# Patient Record
Sex: Female | Born: 1965 | Race: White | Hispanic: No | Marital: Married
Health system: Southern US, Community
[De-identification: ages and names within clinical notes are randomized; demographics above are authoritative.]

---

## 2006-11-01 ENCOUNTER — Ambulatory Visit: Payer: Self-pay | Admitting: Oncology

## 2006-12-27 ENCOUNTER — Ambulatory Visit: Payer: Self-pay | Admitting: Oncology

## 2007-03-05 ENCOUNTER — Ambulatory Visit: Payer: Self-pay | Admitting: Oncology

## 2017-11-28 DIAGNOSIS — D72829 Elevated white blood cell count, unspecified: Secondary | ICD-10-CM | POA: Diagnosis not present

## 2017-11-28 DIAGNOSIS — F17218 Nicotine dependence, cigarettes, with other nicotine-induced disorders: Secondary | ICD-10-CM

## 2018-06-03 ENCOUNTER — Other Ambulatory Visit: Payer: Self-pay | Admitting: Nurse Practitioner

## 2018-06-03 DIAGNOSIS — N63 Unspecified lump in unspecified breast: Secondary | ICD-10-CM

## 2018-06-12 ENCOUNTER — Ambulatory Visit
Admission: RE | Admit: 2018-06-12 | Discharge: 2018-06-12 | Disposition: A | Discharge status: Home / Self Care | Payer: No Typology Code available for payment source | Source: Ambulatory Visit | Attending: Nurse Practitioner | Admitting: Nurse Practitioner

## 2018-06-12 DIAGNOSIS — N63 Unspecified lump in unspecified breast: Secondary | ICD-10-CM

## 2019-02-25 DIAGNOSIS — E1140 Type 2 diabetes mellitus with diabetic neuropathy, unspecified: Secondary | ICD-10-CM | POA: Diagnosis not present

## 2019-02-25 DIAGNOSIS — E1169 Type 2 diabetes mellitus with other specified complication: Secondary | ICD-10-CM | POA: Diagnosis not present

## 2019-03-04 DIAGNOSIS — E1159 Type 2 diabetes mellitus with other circulatory complications: Secondary | ICD-10-CM | POA: Diagnosis not present

## 2019-03-04 DIAGNOSIS — E785 Hyperlipidemia, unspecified: Secondary | ICD-10-CM | POA: Diagnosis not present

## 2019-03-04 DIAGNOSIS — E1169 Type 2 diabetes mellitus with other specified complication: Secondary | ICD-10-CM | POA: Diagnosis not present

## 2019-03-04 DIAGNOSIS — E1140 Type 2 diabetes mellitus with diabetic neuropathy, unspecified: Secondary | ICD-10-CM | POA: Diagnosis not present

## 2019-03-04 DIAGNOSIS — F17200 Nicotine dependence, unspecified, uncomplicated: Secondary | ICD-10-CM | POA: Diagnosis not present

## 2019-03-24 DIAGNOSIS — Z6839 Body mass index (BMI) 39.0-39.9, adult: Secondary | ICD-10-CM | POA: Diagnosis not present

## 2019-03-24 DIAGNOSIS — R202 Paresthesia of skin: Secondary | ICD-10-CM | POA: Diagnosis not present

## 2019-03-24 DIAGNOSIS — J449 Chronic obstructive pulmonary disease, unspecified: Secondary | ICD-10-CM | POA: Diagnosis not present

## 2019-05-26 DIAGNOSIS — E1169 Type 2 diabetes mellitus with other specified complication: Secondary | ICD-10-CM | POA: Diagnosis not present

## 2019-06-02 DIAGNOSIS — E1159 Type 2 diabetes mellitus with other circulatory complications: Secondary | ICD-10-CM | POA: Diagnosis not present

## 2019-06-02 DIAGNOSIS — E785 Hyperlipidemia, unspecified: Secondary | ICD-10-CM | POA: Diagnosis not present

## 2019-06-02 DIAGNOSIS — E1140 Type 2 diabetes mellitus with diabetic neuropathy, unspecified: Secondary | ICD-10-CM | POA: Diagnosis not present

## 2019-06-02 DIAGNOSIS — Z23 Encounter for immunization: Secondary | ICD-10-CM | POA: Diagnosis not present

## 2019-06-02 DIAGNOSIS — E1169 Type 2 diabetes mellitus with other specified complication: Secondary | ICD-10-CM | POA: Diagnosis not present

## 2019-08-26 DIAGNOSIS — E1169 Type 2 diabetes mellitus with other specified complication: Secondary | ICD-10-CM | POA: Diagnosis not present

## 2019-09-01 DIAGNOSIS — F17200 Nicotine dependence, unspecified, uncomplicated: Secondary | ICD-10-CM | POA: Diagnosis not present

## 2019-09-01 DIAGNOSIS — E1169 Type 2 diabetes mellitus with other specified complication: Secondary | ICD-10-CM | POA: Diagnosis not present

## 2019-09-01 DIAGNOSIS — E785 Hyperlipidemia, unspecified: Secondary | ICD-10-CM | POA: Diagnosis not present

## 2019-09-01 DIAGNOSIS — E1140 Type 2 diabetes mellitus with diabetic neuropathy, unspecified: Secondary | ICD-10-CM | POA: Diagnosis not present

## 2019-11-02 DIAGNOSIS — R05 Cough: Secondary | ICD-10-CM | POA: Diagnosis not present

## 2019-11-02 DIAGNOSIS — R0789 Other chest pain: Secondary | ICD-10-CM | POA: Diagnosis not present

## 2019-11-02 DIAGNOSIS — Z6839 Body mass index (BMI) 39.0-39.9, adult: Secondary | ICD-10-CM | POA: Diagnosis not present

## 2019-11-02 DIAGNOSIS — R079 Chest pain, unspecified: Secondary | ICD-10-CM | POA: Diagnosis not present

## 2019-11-10 DIAGNOSIS — E119 Type 2 diabetes mellitus without complications: Secondary | ICD-10-CM | POA: Diagnosis not present

## 2019-11-10 DIAGNOSIS — H20013 Primary iridocyclitis, bilateral: Secondary | ICD-10-CM | POA: Diagnosis not present

## 2019-11-10 DIAGNOSIS — H35033 Hypertensive retinopathy, bilateral: Secondary | ICD-10-CM | POA: Diagnosis not present

## 2019-11-10 DIAGNOSIS — H15003 Unspecified scleritis, bilateral: Secondary | ICD-10-CM | POA: Diagnosis not present

## 2019-11-19 DIAGNOSIS — H209 Unspecified iridocyclitis: Secondary | ICD-10-CM | POA: Diagnosis not present

## 2019-12-03 DIAGNOSIS — E1169 Type 2 diabetes mellitus with other specified complication: Secondary | ICD-10-CM | POA: Diagnosis not present

## 2019-12-03 DIAGNOSIS — Z6839 Body mass index (BMI) 39.0-39.9, adult: Secondary | ICD-10-CM | POA: Diagnosis not present

## 2019-12-03 DIAGNOSIS — M2550 Pain in unspecified joint: Secondary | ICD-10-CM | POA: Diagnosis not present

## 2019-12-03 DIAGNOSIS — R202 Paresthesia of skin: Secondary | ICD-10-CM | POA: Diagnosis not present

## 2019-12-10 DIAGNOSIS — D72829 Elevated white blood cell count, unspecified: Secondary | ICD-10-CM | POA: Diagnosis not present

## 2019-12-10 DIAGNOSIS — E1159 Type 2 diabetes mellitus with other circulatory complications: Secondary | ICD-10-CM | POA: Diagnosis not present

## 2019-12-10 DIAGNOSIS — E1140 Type 2 diabetes mellitus with diabetic neuropathy, unspecified: Secondary | ICD-10-CM | POA: Diagnosis not present

## 2019-12-10 DIAGNOSIS — I152 Hypertension secondary to endocrine disorders: Secondary | ICD-10-CM | POA: Diagnosis not present

## 2019-12-14 DIAGNOSIS — D72829 Elevated white blood cell count, unspecified: Secondary | ICD-10-CM | POA: Diagnosis not present

## 2019-12-16 DIAGNOSIS — H209 Unspecified iridocyclitis: Secondary | ICD-10-CM | POA: Diagnosis not present

## 2019-12-28 DIAGNOSIS — D72829 Elevated white blood cell count, unspecified: Secondary | ICD-10-CM | POA: Diagnosis not present

## 2019-12-28 DIAGNOSIS — F17210 Nicotine dependence, cigarettes, uncomplicated: Secondary | ICD-10-CM | POA: Diagnosis not present

## 2020-01-05 DIAGNOSIS — G5600 Carpal tunnel syndrome, unspecified upper limb: Secondary | ICD-10-CM | POA: Diagnosis not present

## 2020-01-05 DIAGNOSIS — Z6839 Body mass index (BMI) 39.0-39.9, adult: Secondary | ICD-10-CM | POA: Diagnosis not present

## 2020-01-14 DIAGNOSIS — Z1211 Encounter for screening for malignant neoplasm of colon: Secondary | ICD-10-CM | POA: Diagnosis not present

## 2020-01-20 DIAGNOSIS — R200 Anesthesia of skin: Secondary | ICD-10-CM | POA: Diagnosis not present

## 2020-01-20 DIAGNOSIS — M7711 Lateral epicondylitis, right elbow: Secondary | ICD-10-CM | POA: Diagnosis not present

## 2020-01-20 DIAGNOSIS — M65331 Trigger finger, right middle finger: Secondary | ICD-10-CM | POA: Diagnosis not present

## 2020-01-20 DIAGNOSIS — M65342 Trigger finger, left ring finger: Secondary | ICD-10-CM | POA: Diagnosis not present

## 2020-02-18 DIAGNOSIS — E1169 Type 2 diabetes mellitus with other specified complication: Secondary | ICD-10-CM | POA: Diagnosis not present

## 2020-02-25 DIAGNOSIS — E1169 Type 2 diabetes mellitus with other specified complication: Secondary | ICD-10-CM | POA: Diagnosis not present

## 2020-02-25 DIAGNOSIS — I10 Essential (primary) hypertension: Secondary | ICD-10-CM | POA: Diagnosis not present

## 2020-02-25 DIAGNOSIS — E785 Hyperlipidemia, unspecified: Secondary | ICD-10-CM | POA: Diagnosis not present

## 2020-02-25 DIAGNOSIS — F17210 Nicotine dependence, cigarettes, uncomplicated: Secondary | ICD-10-CM | POA: Diagnosis not present

## 2020-02-25 DIAGNOSIS — Z7189 Other specified counseling: Secondary | ICD-10-CM | POA: Diagnosis not present

## 2020-02-25 DIAGNOSIS — E1140 Type 2 diabetes mellitus with diabetic neuropathy, unspecified: Secondary | ICD-10-CM | POA: Diagnosis not present

## 2020-02-25 DIAGNOSIS — F17200 Nicotine dependence, unspecified, uncomplicated: Secondary | ICD-10-CM | POA: Diagnosis not present

## 2020-02-25 DIAGNOSIS — Z23 Encounter for immunization: Secondary | ICD-10-CM | POA: Diagnosis not present

## 2020-03-07 ENCOUNTER — Other Ambulatory Visit: Payer: Self-pay

## 2020-03-07 DIAGNOSIS — R202 Paresthesia of skin: Secondary | ICD-10-CM

## 2020-03-08 ENCOUNTER — Ambulatory Visit (INDEPENDENT_AMBULATORY_CARE_PROVIDER_SITE_OTHER): Payer: BC Managed Care – PPO | Admitting: Neurology

## 2020-03-08 ENCOUNTER — Other Ambulatory Visit: Payer: Self-pay

## 2020-03-08 DIAGNOSIS — R202 Paresthesia of skin: Secondary | ICD-10-CM | POA: Diagnosis not present

## 2020-03-08 DIAGNOSIS — G5603 Carpal tunnel syndrome, bilateral upper limbs: Secondary | ICD-10-CM

## 2020-03-08 NOTE — Procedures (Signed)
[facility]  [address], [address]  [place], [place] [address] Tel: [phone] Fax:  [phone] Test Date:  03/08/2020  Patient: Shannon Drake DOB: 02/13/1966 Physician: Nita Sickle, DO  Sex: Female Height: 5\' 9"  Ref Phys: , MD  ID#: [id number] Salt Temp: 34.0C Technician:    Patient Complaints: This is a 54 year old female referred for evaluation of bilateral hand pain and paresthesias.  NCV & EMG Findings: Extensive electrodiagnostic testing of the right upper extremity and additional studies of the left shows:  1. Right median sensory response shows prolonged latency (4.5 ms) and reduced amplitude (9.6 V). Left median sensory response shows prolonged latency (4.1 ms). Bilateral ulnar sensory responses are within normal limits, although left is asymmetrically reduced as compared to the right (L13.4, L24.8 9.6 V). 2. Right median motor response shows prolonged latency (4.4 ms). Left ulnar motor response shows slowed conduction velocity across the elbow (A Elbow-B Elbow, 45 m/s). Right ulnar and left median motor responses are within normal limits.  3. There is no evidence of active or chronic motor axonal loss changes affecting any of the tested muscles.  Motor unit configuration and recruitment pattern is within normal limits.    Impression: 1. 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   2. Left median neuropathy at or distal to the wrist (mild), consistent with a clinical diagnosis of carpal tunnel syndrome. 3. Left ulnar neuropathy with slowing across the elbow, predominantly demyelinating, mild.   ___________________________ 46, DO    Nerve Conduction Studies Anti Sensory Summary Table   Stim Site NR Peak (ms) Norm Peak (ms) P-T Amp (V) Norm P-T Amp  Left Median Anti Sensory (2nd Digit)  34C  Wrist    4.1 <3.6 20.3 >15  Right Median Anti Sensory (2nd  Digit)  34C  Wrist    4.5 <3.6 9.6 >15  Left Ulnar Anti Sensory (5th Digit)  34C  Wrist    2.9 <3.1 13.4 >10  Right Ulnar Anti Sensory (5th Digit)  34C  Wrist    2.6 <3.1 24.8 >10   Motor Summary Table   Stim Site NR Onset (ms) Norm Onset (ms) O-P Amp (mV) Norm O-P Amp Site1 Site2 Delta-0 (ms) Dist (cm) Vel (m/s) Norm Vel (m/s)  Left Median Motor (Abd Poll Brev)  34C  Wrist    3.8 <4.0 6.5 >6 Elbow Wrist 5.0 27.0 54 >50  Elbow    8.8  6.2         Right Median Motor (Abd Poll Brev)  34C  Wrist    4.4 <4.0 6.8 >6 Elbow Wrist 4.5 27.0 60 >50  Elbow    8.9  6.4         Left Ulnar Motor (Abd Dig Minimi)  34C  Wrist    2.3 <3.1 7.7 >7 B Elbow Wrist 3.8 23.0 61 >50  B Elbow    6.1  7.1  A Elbow B Elbow 2.2 10.0 45 >50  A Elbow    8.3  6.6         Right Ulnar Motor (Abd Dig Minimi)  34C  Wrist    2.2 <3.1 8.0 >7 B Elbow Wrist 3.5 22.0 63 >50  B Elbow    5.7  7.5  A Elbow B Elbow 1.7 10.0 59 >50  A Elbow    7.4  7.4          EMG  Side Muscle Ins Act Fibs Psw Fasc Number Recrt Dur Dur. Amp Amp. Poly Poly. Comment  Right 1stDorInt Nml Nml Nml Nml Nml Nml Nml Nml Nml Nml Nml Nml N/A  Right Abd Poll Brev Nml Nml Nml Nml Nml Nml Nml Nml Nml Nml Nml Nml N/A  Right PronatorTeres Nml Nml Nml Nml Nml Nml Nml Nml Nml Nml Nml Nml N/A  Right Triceps Nml Nml Nml Nml Nml Nml Nml Nml Nml Nml Nml Nml N/A  Right Deltoid Nml Nml Nml Nml Nml Nml Nml Nml Nml Nml Nml Nml N/A  Left 1stDorInt Nml Nml Nml Nml Nml Nml Nml Nml Nml Nml Nml Nml N/A  Left Abd Poll Brev Nml Nml Nml Nml Nml Nml Nml Nml Nml Nml Nml Nml N/A  Left PronatorTeres Nml Nml Nml Nml Nml Nml Nml Nml Nml Nml Nml Nml N/A  Left Biceps Nml Nml Nml Nml Nml Nml Nml Nml Nml Nml Nml Nml N/A  Left Triceps Nml Nml Nml Nml Nml Nml Nml Nml Nml Nml Nml Nml N/A  Left Deltoid Nml Nml Nml Nml Nml Nml Nml Nml Nml Nml Nml Nml N/A  Left FlexDigProf 4,5 Nml Nml Nml Nml Nml Nml Nml Nml Nml Nml Nml Nml N/A      Waveforms:

## 2020-03-10 DIAGNOSIS — Z6838 Body mass index (BMI) 38.0-38.9, adult: Secondary | ICD-10-CM | POA: Diagnosis not present

## 2020-03-10 DIAGNOSIS — E1159 Type 2 diabetes mellitus with other circulatory complications: Secondary | ICD-10-CM | POA: Diagnosis not present

## 2020-03-10 DIAGNOSIS — I152 Hypertension secondary to endocrine disorders: Secondary | ICD-10-CM | POA: Diagnosis not present

## 2020-03-16 DIAGNOSIS — G5603 Carpal tunnel syndrome, bilateral upper limbs: Secondary | ICD-10-CM | POA: Diagnosis not present

## 2020-03-16 DIAGNOSIS — G5622 Lesion of ulnar nerve, left upper limb: Secondary | ICD-10-CM | POA: Diagnosis not present

## 2020-03-16 DIAGNOSIS — M7711 Lateral epicondylitis, right elbow: Secondary | ICD-10-CM | POA: Diagnosis not present

## 2020-04-07 DIAGNOSIS — M25521 Pain in right elbow: Secondary | ICD-10-CM | POA: Diagnosis not present

## 2020-04-07 DIAGNOSIS — M25631 Stiffness of right wrist, not elsewhere classified: Secondary | ICD-10-CM | POA: Diagnosis not present

## 2020-04-07 DIAGNOSIS — M7711 Lateral epicondylitis, right elbow: Secondary | ICD-10-CM | POA: Diagnosis not present

## 2020-04-08 DIAGNOSIS — Z6837 Body mass index (BMI) 37.0-37.9, adult: Secondary | ICD-10-CM | POA: Diagnosis not present

## 2020-04-08 DIAGNOSIS — R252 Cramp and spasm: Secondary | ICD-10-CM | POA: Diagnosis not present

## 2020-04-08 DIAGNOSIS — I10 Essential (primary) hypertension: Secondary | ICD-10-CM | POA: Diagnosis not present

## 2020-05-19 DIAGNOSIS — E1169 Type 2 diabetes mellitus with other specified complication: Secondary | ICD-10-CM | POA: Diagnosis not present

## 2020-05-27 DIAGNOSIS — M65342 Trigger finger, left ring finger: Secondary | ICD-10-CM | POA: Diagnosis not present

## 2020-05-27 DIAGNOSIS — G5622 Lesion of ulnar nerve, left upper limb: Secondary | ICD-10-CM | POA: Diagnosis not present

## 2020-05-27 DIAGNOSIS — G5603 Carpal tunnel syndrome, bilateral upper limbs: Secondary | ICD-10-CM | POA: Diagnosis not present

## 2020-05-31 DIAGNOSIS — E785 Hyperlipidemia, unspecified: Secondary | ICD-10-CM | POA: Diagnosis not present

## 2020-05-31 DIAGNOSIS — E1169 Type 2 diabetes mellitus with other specified complication: Secondary | ICD-10-CM | POA: Diagnosis not present

## 2020-05-31 DIAGNOSIS — I10 Essential (primary) hypertension: Secondary | ICD-10-CM | POA: Diagnosis not present

## 2020-05-31 DIAGNOSIS — F529 Unspecified sexual dysfunction not due to a substance or known physiological condition: Secondary | ICD-10-CM | POA: Diagnosis not present

## 2020-05-31 DIAGNOSIS — E1140 Type 2 diabetes mellitus with diabetic neuropathy, unspecified: Secondary | ICD-10-CM | POA: Diagnosis not present

## 2020-07-01 DIAGNOSIS — J449 Chronic obstructive pulmonary disease, unspecified: Secondary | ICD-10-CM | POA: Diagnosis not present

## 2020-07-01 DIAGNOSIS — R059 Cough, unspecified: Secondary | ICD-10-CM | POA: Diagnosis not present

## 2020-07-01 DIAGNOSIS — Z20822 Contact with and (suspected) exposure to covid-19: Secondary | ICD-10-CM | POA: Diagnosis not present

## 2020-07-01 DIAGNOSIS — G4700 Insomnia, unspecified: Secondary | ICD-10-CM | POA: Diagnosis not present

## 2020-07-01 DIAGNOSIS — Z1152 Encounter for screening for COVID-19: Secondary | ICD-10-CM | POA: Diagnosis not present

## 2020-09-07 DIAGNOSIS — Z6837 Body mass index (BMI) 37.0-37.9, adult: Secondary | ICD-10-CM | POA: Diagnosis not present

## 2020-09-07 DIAGNOSIS — H93299 Other abnormal auditory perceptions, unspecified ear: Secondary | ICD-10-CM | POA: Diagnosis not present

## 2020-09-14 DIAGNOSIS — E1169 Type 2 diabetes mellitus with other specified complication: Secondary | ICD-10-CM | POA: Diagnosis not present

## 2020-09-28 DIAGNOSIS — M65331 Trigger finger, right middle finger: Secondary | ICD-10-CM | POA: Diagnosis not present

## 2020-10-07 DIAGNOSIS — E785 Hyperlipidemia, unspecified: Secondary | ICD-10-CM | POA: Diagnosis not present

## 2020-10-07 DIAGNOSIS — E1159 Type 2 diabetes mellitus with other circulatory complications: Secondary | ICD-10-CM | POA: Diagnosis not present

## 2020-10-07 DIAGNOSIS — E1169 Type 2 diabetes mellitus with other specified complication: Secondary | ICD-10-CM | POA: Diagnosis not present

## 2020-10-07 DIAGNOSIS — I152 Hypertension secondary to endocrine disorders: Secondary | ICD-10-CM | POA: Diagnosis not present

## 2020-11-09 DIAGNOSIS — M65332 Trigger finger, left middle finger: Secondary | ICD-10-CM | POA: Diagnosis not present

## 2020-11-18 DIAGNOSIS — R600 Localized edema: Secondary | ICD-10-CM | POA: Diagnosis not present

## 2020-11-18 DIAGNOSIS — M1712 Unilateral primary osteoarthritis, left knee: Secondary | ICD-10-CM | POA: Diagnosis not present

## 2020-11-18 DIAGNOSIS — S8392XA Sprain of unspecified site of left knee, initial encounter: Secondary | ICD-10-CM | POA: Diagnosis not present

## 2020-11-18 DIAGNOSIS — M25562 Pain in left knee: Secondary | ICD-10-CM | POA: Diagnosis not present

## 2020-11-18 DIAGNOSIS — W109XXA Fall (on) (from) unspecified stairs and steps, initial encounter: Secondary | ICD-10-CM | POA: Diagnosis not present

## 2020-11-18 DIAGNOSIS — M25462 Effusion, left knee: Secondary | ICD-10-CM | POA: Diagnosis not present

## 2020-12-23 IMAGING — MG STEREOTACTIC CORE NEEDLE BIOPSY
8 of 15 series · 8 of 23 positions shown · non-contrast
Comparison: Previous exams.

Addendum:
CLINICAL DATA: Patient presents for stereotactic core needle biopsy
of left breast calcifications.

EXAM:
LEFT BREAST STEREOTACTIC CORE NEEDLE BIOPSY

[L (1 of 8)]
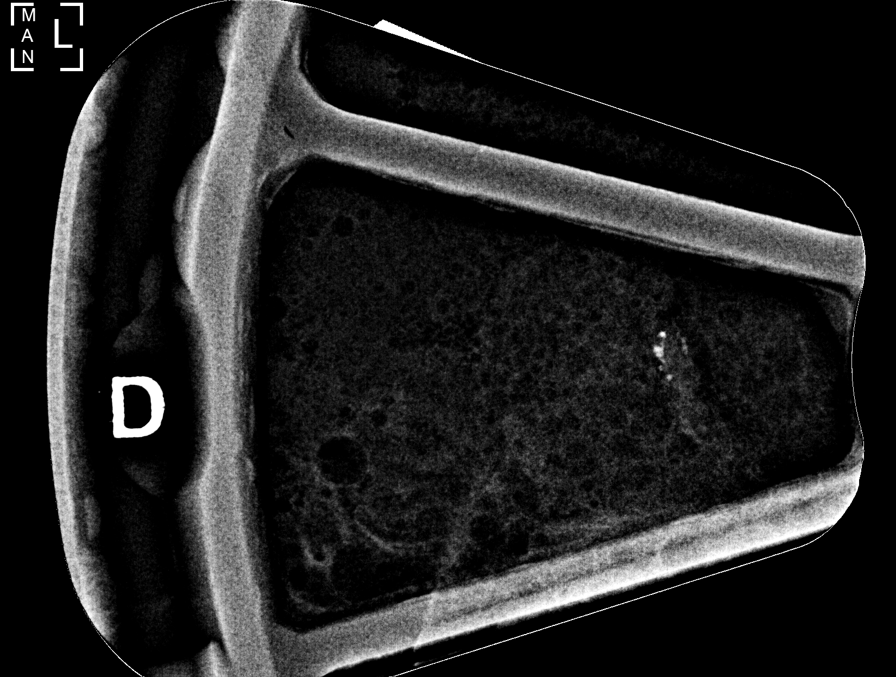

[L (2 of 8)]
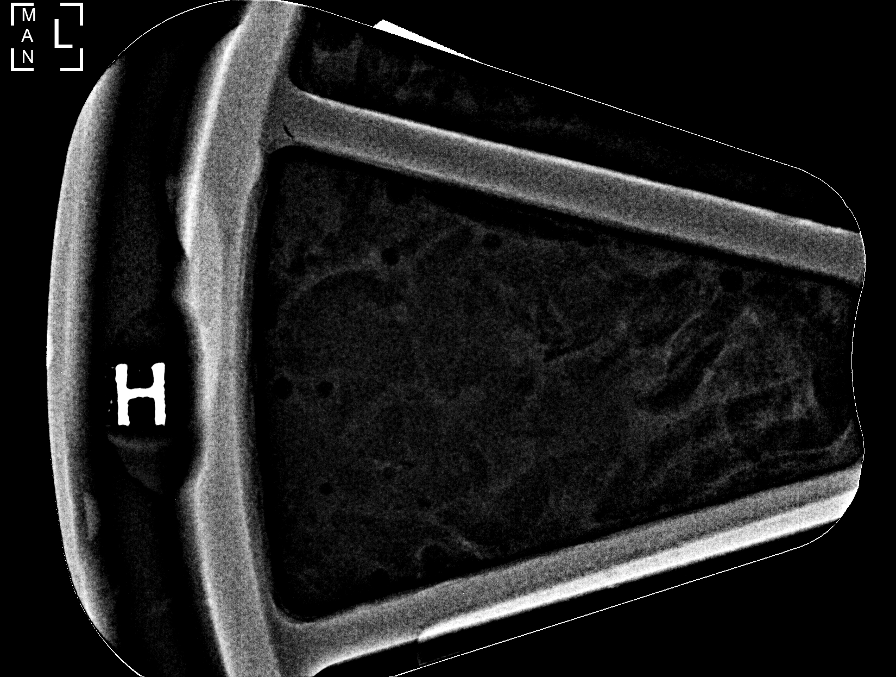

[L (3 of 8)]
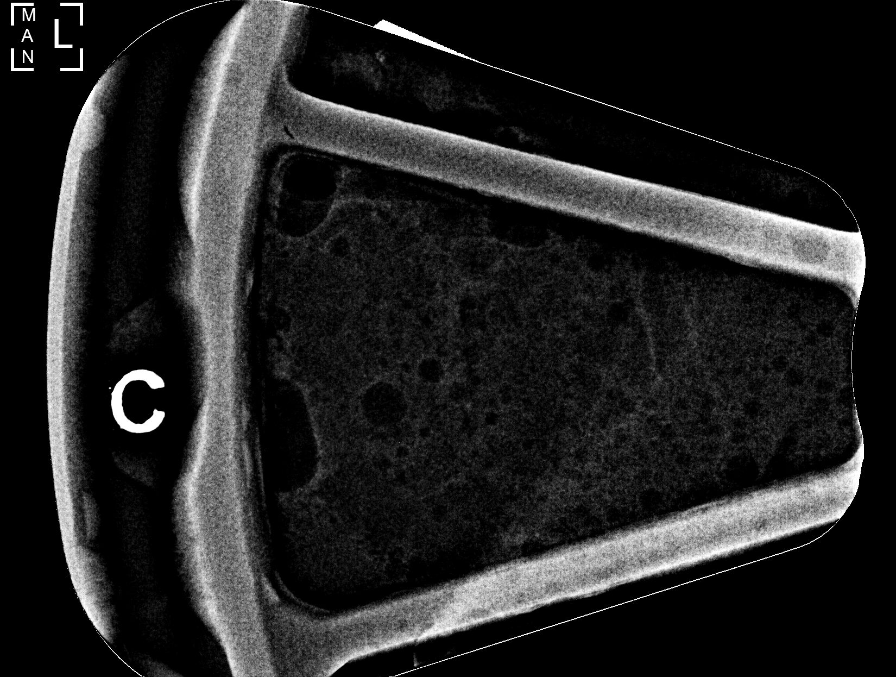

[L (4 of 8)]
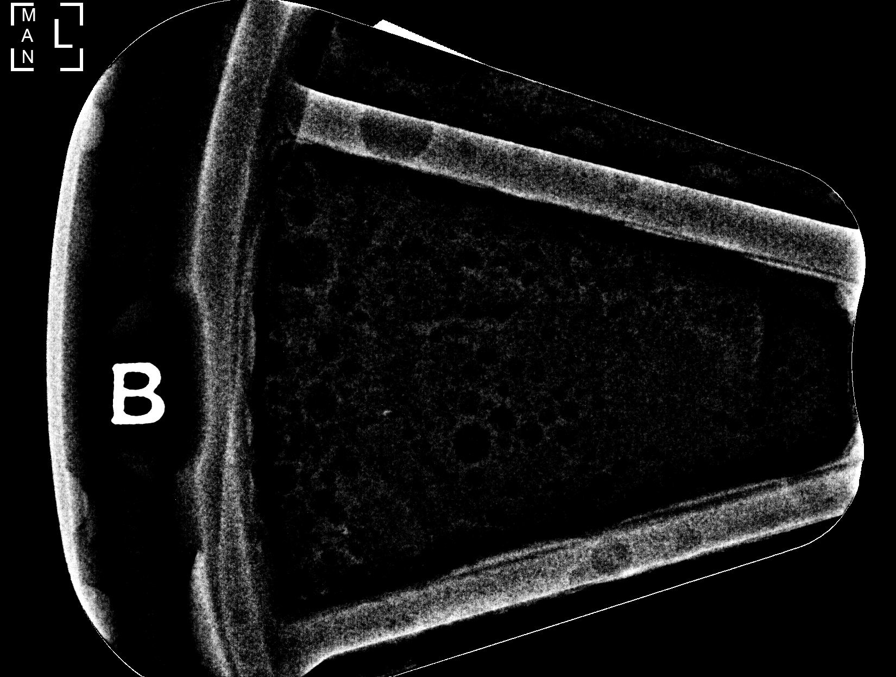

[L (5 of 8)]
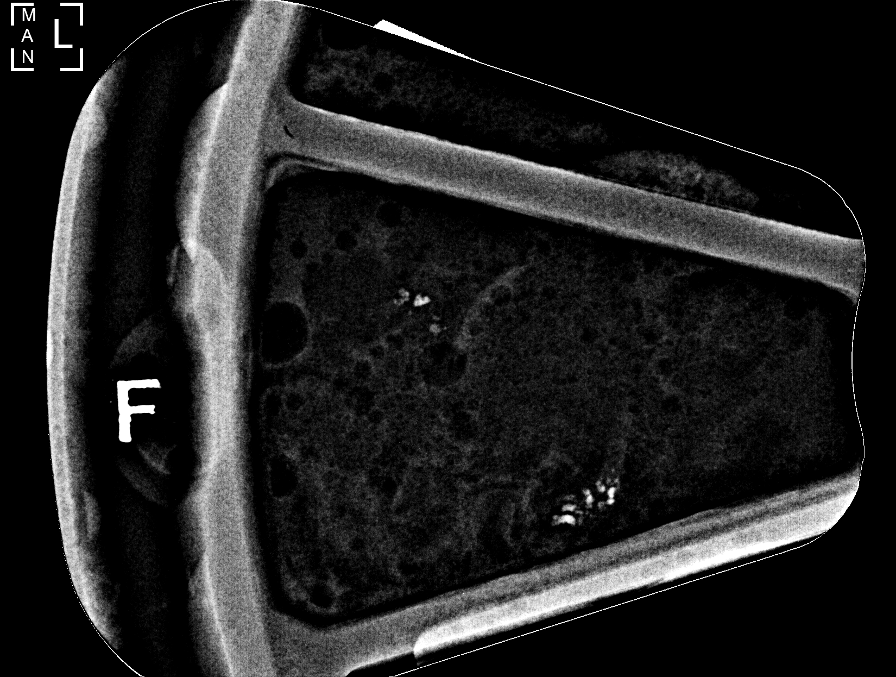

[L (6 of 8)]
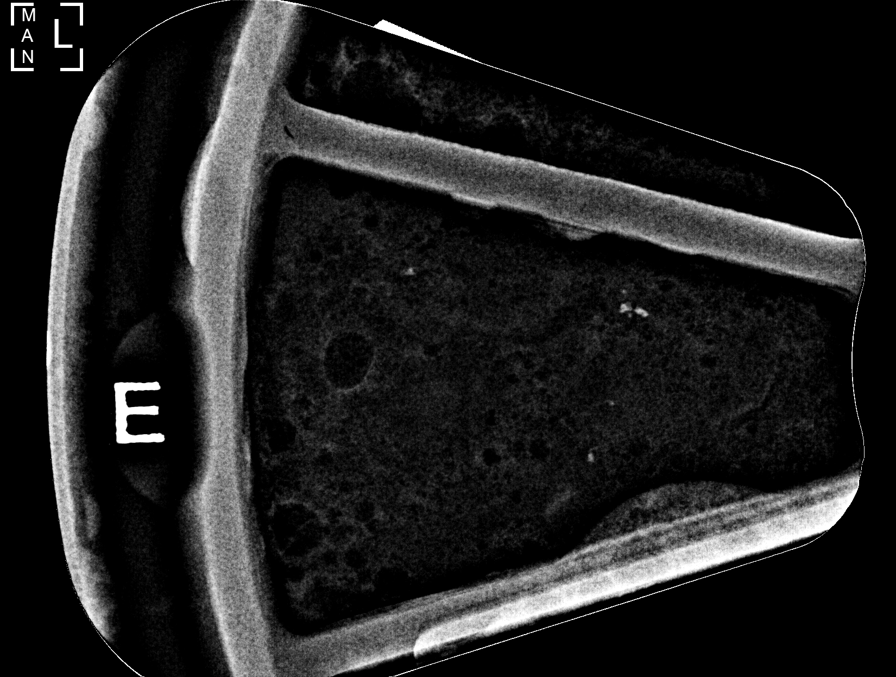

[L (7 of 8)]
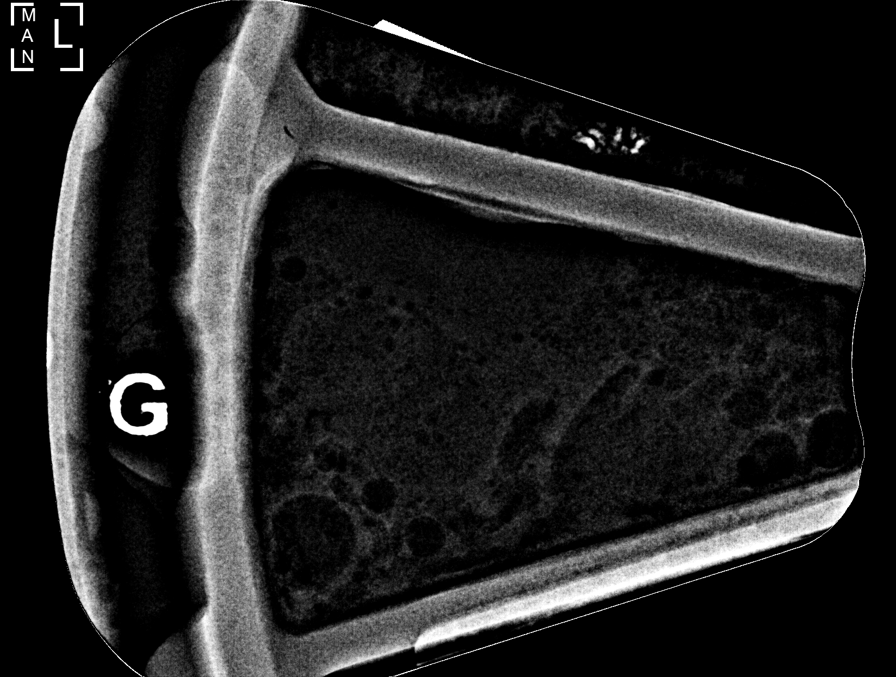

[L (8 of 8)]
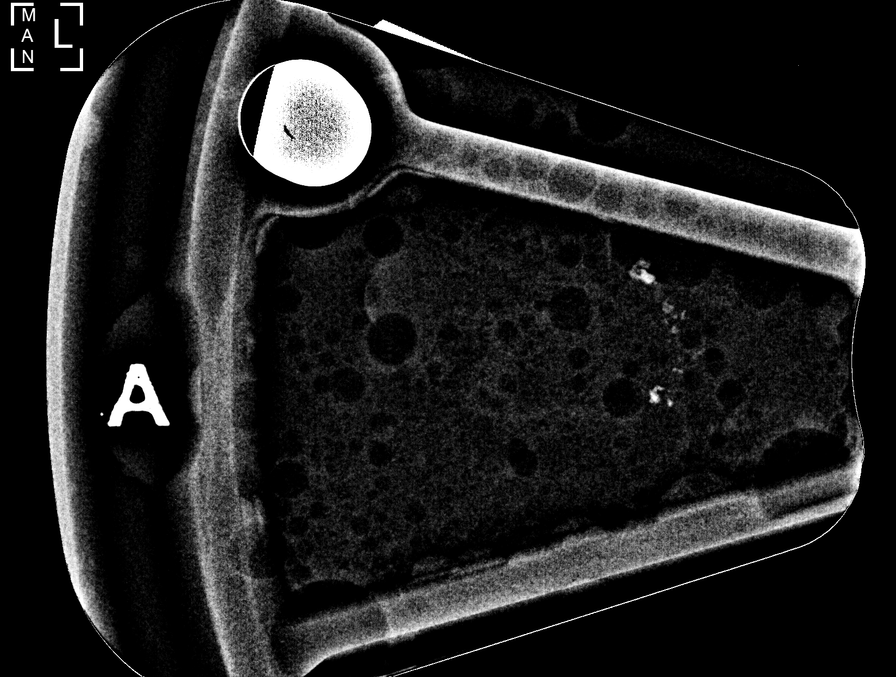

[8 of 23 positions shown; findings below may reference images not displayed]



Using sterile technique and 1% Lidocaine as local anesthetic, under
stereotactic guidance, a 9 gauge vacuum assisted device was used to
perform core needle biopsy of calcifications in the 2:45 o'clock of
the left breast using a lateral approach. Specimen radiograph was
performed showing calcifications for which biopsy was performed.
Specimens with calcifications are identified for pathology.

Lesion quadrant: Upper outer quadrant

At the conclusion of the procedure, a coil shaped tissue marker clip
was deployed into the biopsy cavity. Follow-up 2-view mammogram was
performed and dictated separately.
IMPRESSION: Stereotactic-guided biopsy of left breast calcifications. No
apparent complications.

ADDENDUM:
Pathology revealed FIBROADENOMATOID NODULE WITH CALCIFICATIONS of
the Left breast, 2:45 o'clock. This was found to be concordant by
Dr. Lenston Wahington.

Pathology results were discussed with the patient by telephone. The
patient reported doing well after the biopsy with tenderness at the
site. Post biopsy instructions and care were reviewed and questions
were answered. The patient was encouraged to call The [REDACTED]

The patient was instructed to return for annual screening
mammography at [facility] in [HOSPITAL][HOSPITAL].

Pathology results reported by Viinicius Boca, RN on 06/13/2018.

*** End of Addendum ***

## 2020-12-23 IMAGING — MG MM BREAST LOCALIZATION CLIP
2 series · 2 of 2 positions shown · non-contrast
Comparison: Previous exam(s).

CLINICAL DATA: Status post stereotactic core needle biopsy of left
breast calcifications.

EXAM:
DIAGNOSTIC LEFT MAMMOGRAM POST STEREOTACTIC BIOPSY

[L ML]
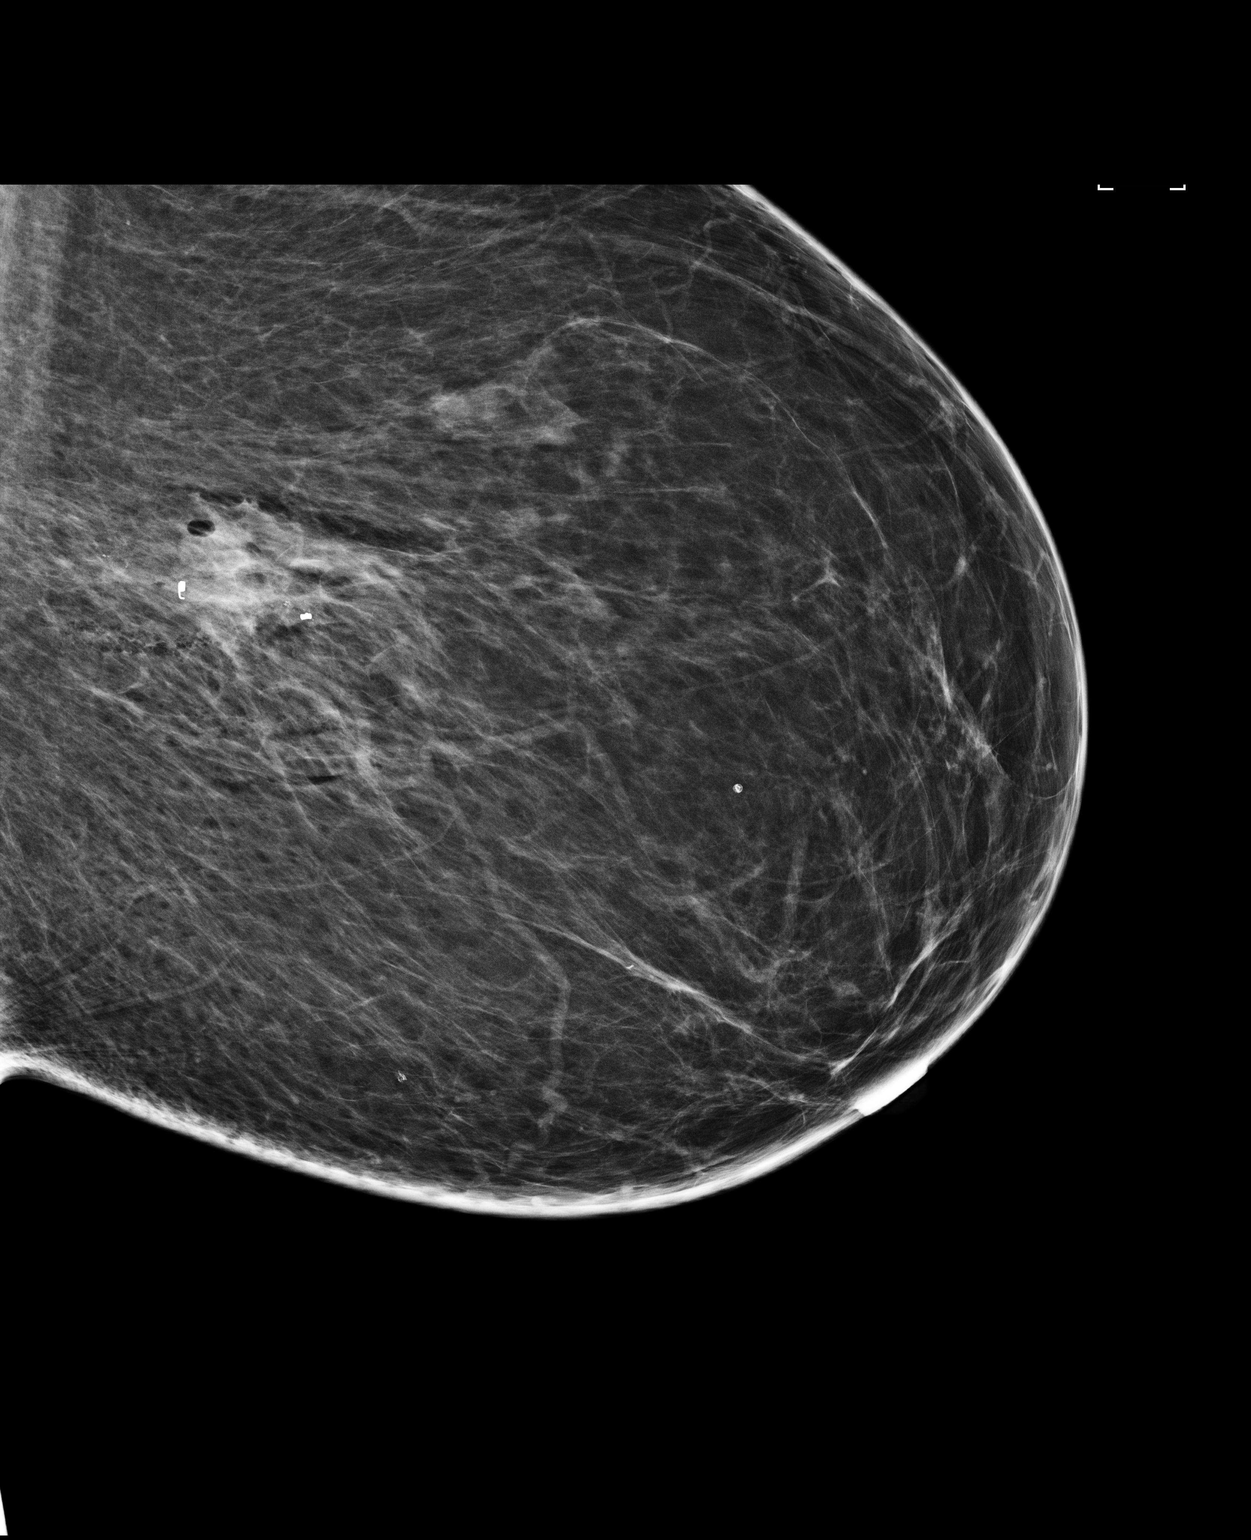

[L CC]
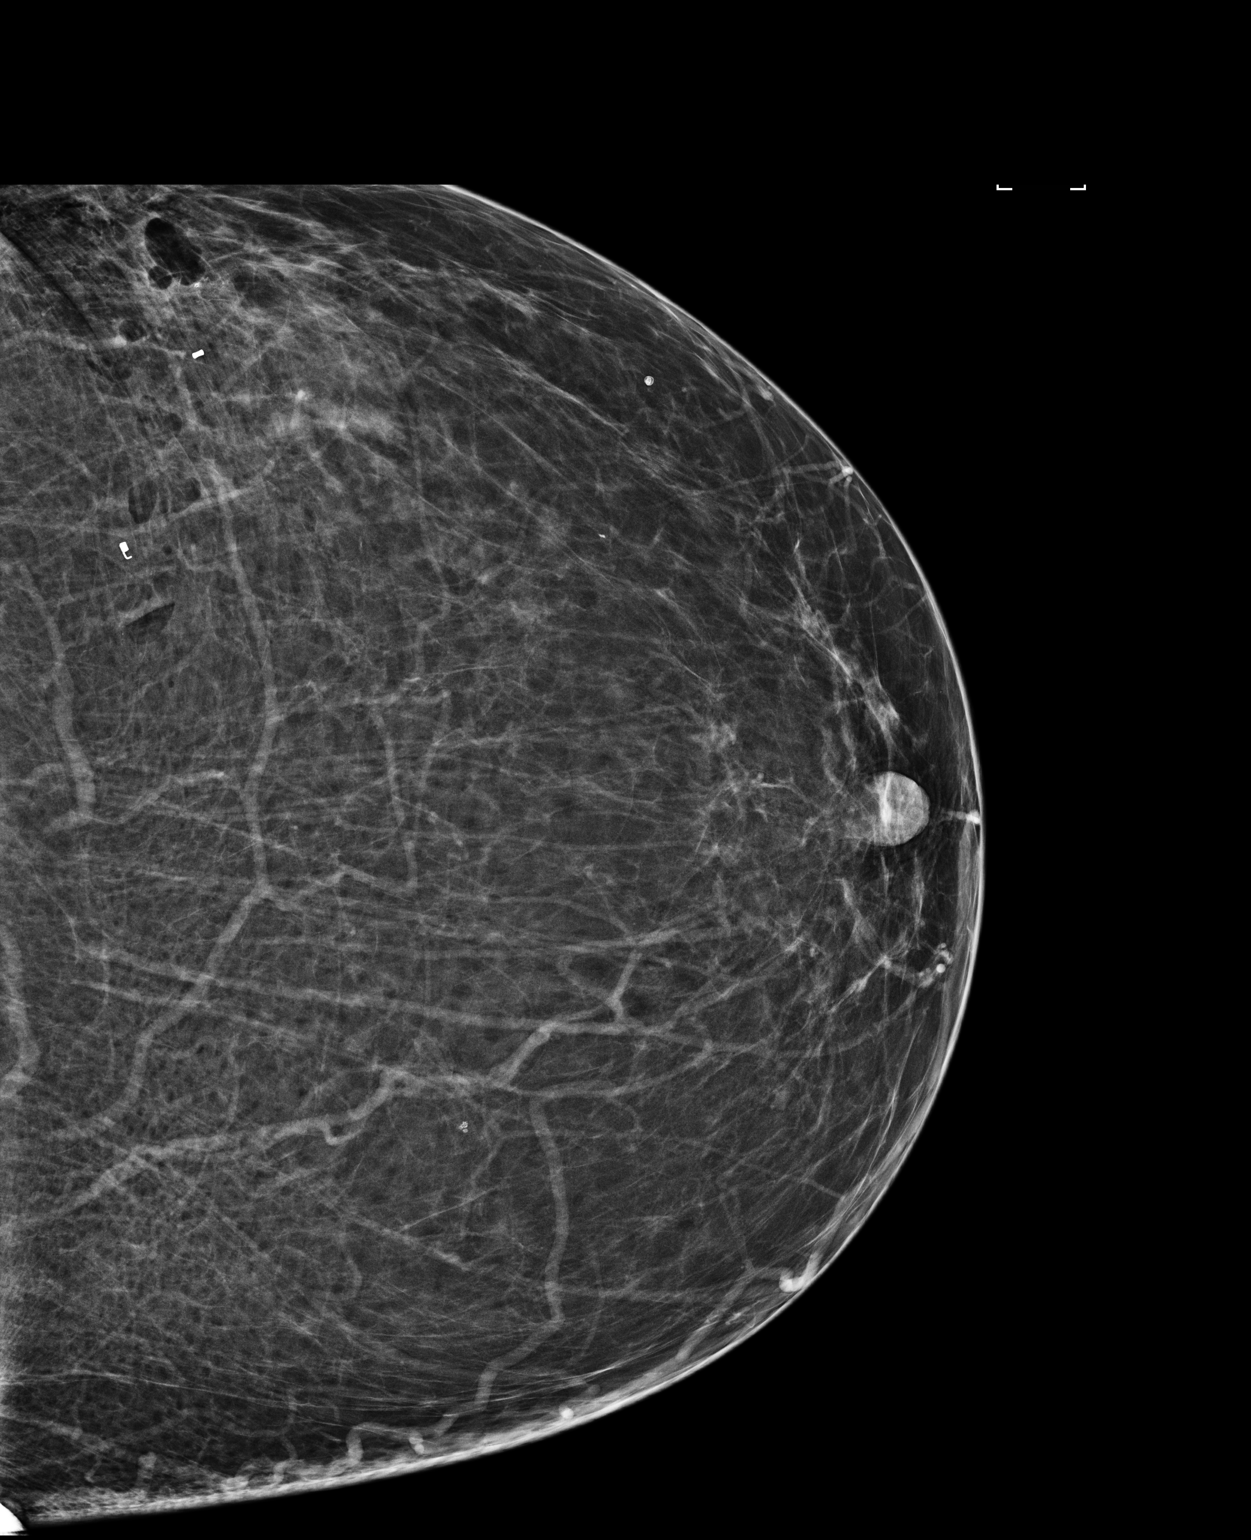

[2 of 2 positions shown; findings below may reference images not displayed]

FINDINGS: Mammographic images were obtained following stereotactic guided
biopsy of left breast calcifications. The coil shaped biopsy clip
projects 4 cm lateral to residual calcifications noted in the medial
left breast.
IMPRESSION: Coil shaped biopsy clip is displaced 4 cm medial to the biopsied
left breast calcifications.

Final Assessment: Post Procedure Mammograms for Marker Placement

## 2021-01-09 DIAGNOSIS — E1169 Type 2 diabetes mellitus with other specified complication: Secondary | ICD-10-CM | POA: Diagnosis not present

## 2021-01-13 DIAGNOSIS — E1159 Type 2 diabetes mellitus with other circulatory complications: Secondary | ICD-10-CM | POA: Diagnosis not present

## 2021-01-13 DIAGNOSIS — I152 Hypertension secondary to endocrine disorders: Secondary | ICD-10-CM | POA: Diagnosis not present

## 2021-01-13 DIAGNOSIS — E785 Hyperlipidemia, unspecified: Secondary | ICD-10-CM | POA: Diagnosis not present

## 2021-01-13 DIAGNOSIS — E1140 Type 2 diabetes mellitus with diabetic neuropathy, unspecified: Secondary | ICD-10-CM | POA: Diagnosis not present

## 2021-01-13 DIAGNOSIS — E1169 Type 2 diabetes mellitus with other specified complication: Secondary | ICD-10-CM | POA: Diagnosis not present

## 2021-01-25 DIAGNOSIS — M65332 Trigger finger, left middle finger: Secondary | ICD-10-CM | POA: Diagnosis not present

## 2021-01-25 DIAGNOSIS — M65342 Trigger finger, left ring finger: Secondary | ICD-10-CM | POA: Diagnosis not present

## 2021-01-25 DIAGNOSIS — M65322 Trigger finger, left index finger: Secondary | ICD-10-CM | POA: Diagnosis not present

## 2021-04-17 DIAGNOSIS — E1169 Type 2 diabetes mellitus with other specified complication: Secondary | ICD-10-CM | POA: Diagnosis not present

## 2021-04-17 DIAGNOSIS — E1140 Type 2 diabetes mellitus with diabetic neuropathy, unspecified: Secondary | ICD-10-CM | POA: Diagnosis not present

## 2021-04-17 DIAGNOSIS — E1159 Type 2 diabetes mellitus with other circulatory complications: Secondary | ICD-10-CM | POA: Diagnosis not present

## 2021-04-20 DIAGNOSIS — E785 Hyperlipidemia, unspecified: Secondary | ICD-10-CM | POA: Diagnosis not present

## 2021-04-20 DIAGNOSIS — R002 Palpitations: Secondary | ICD-10-CM | POA: Diagnosis not present

## 2021-04-20 DIAGNOSIS — I152 Hypertension secondary to endocrine disorders: Secondary | ICD-10-CM | POA: Diagnosis not present

## 2021-04-20 DIAGNOSIS — E1159 Type 2 diabetes mellitus with other circulatory complications: Secondary | ICD-10-CM | POA: Diagnosis not present

## 2021-04-20 DIAGNOSIS — E1169 Type 2 diabetes mellitus with other specified complication: Secondary | ICD-10-CM | POA: Diagnosis not present

## 2021-05-03 DIAGNOSIS — R002 Palpitations: Secondary | ICD-10-CM | POA: Diagnosis not present

## 2021-05-22 DIAGNOSIS — Z23 Encounter for immunization: Secondary | ICD-10-CM | POA: Diagnosis not present

## 2021-05-22 DIAGNOSIS — J45909 Unspecified asthma, uncomplicated: Secondary | ICD-10-CM | POA: Diagnosis not present

## 2021-05-22 DIAGNOSIS — Z1231 Encounter for screening mammogram for malignant neoplasm of breast: Secondary | ICD-10-CM | POA: Diagnosis not present

## 2021-05-22 DIAGNOSIS — R079 Chest pain, unspecified: Secondary | ICD-10-CM | POA: Diagnosis not present

## 2021-05-22 DIAGNOSIS — J449 Chronic obstructive pulmonary disease, unspecified: Secondary | ICD-10-CM | POA: Diagnosis not present

## 2021-05-26 DIAGNOSIS — Z720 Tobacco use: Secondary | ICD-10-CM | POA: Diagnosis not present

## 2021-05-26 DIAGNOSIS — Z20822 Contact with and (suspected) exposure to covid-19: Secondary | ICD-10-CM | POA: Diagnosis not present

## 2021-05-26 DIAGNOSIS — R059 Cough, unspecified: Secondary | ICD-10-CM | POA: Diagnosis not present

## 2021-05-26 DIAGNOSIS — F17210 Nicotine dependence, cigarettes, uncomplicated: Secondary | ICD-10-CM | POA: Diagnosis not present

## 2021-05-26 DIAGNOSIS — J029 Acute pharyngitis, unspecified: Secondary | ICD-10-CM | POA: Diagnosis not present

## 2021-06-09 DIAGNOSIS — R079 Chest pain, unspecified: Secondary | ICD-10-CM | POA: Diagnosis not present

## 2021-07-25 DIAGNOSIS — E1169 Type 2 diabetes mellitus with other specified complication: Secondary | ICD-10-CM | POA: Diagnosis not present

## 2021-08-01 DIAGNOSIS — E785 Hyperlipidemia, unspecified: Secondary | ICD-10-CM | POA: Diagnosis not present

## 2021-08-01 DIAGNOSIS — I152 Hypertension secondary to endocrine disorders: Secondary | ICD-10-CM | POA: Diagnosis not present

## 2021-08-01 DIAGNOSIS — E1169 Type 2 diabetes mellitus with other specified complication: Secondary | ICD-10-CM | POA: Diagnosis not present

## 2021-08-01 DIAGNOSIS — E1159 Type 2 diabetes mellitus with other circulatory complications: Secondary | ICD-10-CM | POA: Diagnosis not present

## 2021-08-31 DIAGNOSIS — Z1211 Encounter for screening for malignant neoplasm of colon: Secondary | ICD-10-CM | POA: Diagnosis not present

## 2021-08-31 DIAGNOSIS — Z1212 Encounter for screening for malignant neoplasm of rectum: Secondary | ICD-10-CM | POA: Diagnosis not present

## 2021-10-24 DIAGNOSIS — E1169 Type 2 diabetes mellitus with other specified complication: Secondary | ICD-10-CM | POA: Diagnosis not present

## 2021-10-31 DIAGNOSIS — E1169 Type 2 diabetes mellitus with other specified complication: Secondary | ICD-10-CM | POA: Diagnosis not present

## 2021-10-31 DIAGNOSIS — E1140 Type 2 diabetes mellitus with diabetic neuropathy, unspecified: Secondary | ICD-10-CM | POA: Diagnosis not present

## 2021-10-31 DIAGNOSIS — J449 Chronic obstructive pulmonary disease, unspecified: Secondary | ICD-10-CM | POA: Diagnosis not present

## 2021-10-31 DIAGNOSIS — B029 Zoster without complications: Secondary | ICD-10-CM | POA: Diagnosis not present

## 2021-10-31 DIAGNOSIS — E785 Hyperlipidemia, unspecified: Secondary | ICD-10-CM | POA: Diagnosis not present

## 2021-11-07 DIAGNOSIS — Z20822 Contact with and (suspected) exposure to covid-19: Secondary | ICD-10-CM | POA: Diagnosis not present

## 2021-11-07 DIAGNOSIS — Z6833 Body mass index (BMI) 33.0-33.9, adult: Secondary | ICD-10-CM | POA: Diagnosis not present

## 2021-11-07 DIAGNOSIS — J449 Chronic obstructive pulmonary disease, unspecified: Secondary | ICD-10-CM | POA: Diagnosis not present

## 2021-11-07 DIAGNOSIS — J45909 Unspecified asthma, uncomplicated: Secondary | ICD-10-CM | POA: Diagnosis not present

## 2021-11-07 DIAGNOSIS — Z1152 Encounter for screening for COVID-19: Secondary | ICD-10-CM | POA: Diagnosis not present

## 2021-11-07 DIAGNOSIS — B029 Zoster without complications: Secondary | ICD-10-CM | POA: Diagnosis not present

## 2021-11-09 DIAGNOSIS — B029 Zoster without complications: Secondary | ICD-10-CM | POA: Diagnosis not present

## 2022-01-23 DIAGNOSIS — E1159 Type 2 diabetes mellitus with other circulatory complications: Secondary | ICD-10-CM | POA: Diagnosis not present

## 2022-01-23 DIAGNOSIS — E1169 Type 2 diabetes mellitus with other specified complication: Secondary | ICD-10-CM | POA: Diagnosis not present

## 2022-01-23 DIAGNOSIS — E1140 Type 2 diabetes mellitus with diabetic neuropathy, unspecified: Secondary | ICD-10-CM | POA: Diagnosis not present

## 2022-01-30 DIAGNOSIS — E1169 Type 2 diabetes mellitus with other specified complication: Secondary | ICD-10-CM | POA: Diagnosis not present

## 2022-01-30 DIAGNOSIS — E785 Hyperlipidemia, unspecified: Secondary | ICD-10-CM | POA: Diagnosis not present

## 2022-01-30 DIAGNOSIS — I10 Essential (primary) hypertension: Secondary | ICD-10-CM | POA: Diagnosis not present

## 2022-01-30 DIAGNOSIS — Z6833 Body mass index (BMI) 33.0-33.9, adult: Secondary | ICD-10-CM | POA: Diagnosis not present

## 2022-01-30 DIAGNOSIS — F330 Major depressive disorder, recurrent, mild: Secondary | ICD-10-CM | POA: Diagnosis not present

## 2022-02-13 DIAGNOSIS — Z6834 Body mass index (BMI) 34.0-34.9, adult: Secondary | ICD-10-CM | POA: Diagnosis not present

## 2022-02-13 DIAGNOSIS — F330 Major depressive disorder, recurrent, mild: Secondary | ICD-10-CM | POA: Diagnosis not present

## 2022-03-13 DIAGNOSIS — F330 Major depressive disorder, recurrent, mild: Secondary | ICD-10-CM | POA: Diagnosis not present

## 2022-03-13 DIAGNOSIS — Z6834 Body mass index (BMI) 34.0-34.9, adult: Secondary | ICD-10-CM | POA: Diagnosis not present

## 2022-05-17 DIAGNOSIS — E1159 Type 2 diabetes mellitus with other circulatory complications: Secondary | ICD-10-CM | POA: Diagnosis not present

## 2022-05-17 DIAGNOSIS — E1140 Type 2 diabetes mellitus with diabetic neuropathy, unspecified: Secondary | ICD-10-CM | POA: Diagnosis not present

## 2022-05-17 DIAGNOSIS — E1169 Type 2 diabetes mellitus with other specified complication: Secondary | ICD-10-CM | POA: Diagnosis not present

## 2022-05-29 DIAGNOSIS — Z1331 Encounter for screening for depression: Secondary | ICD-10-CM | POA: Diagnosis not present

## 2022-05-29 DIAGNOSIS — Z9181 History of falling: Secondary | ICD-10-CM | POA: Diagnosis not present

## 2022-05-29 DIAGNOSIS — Z23 Encounter for immunization: Secondary | ICD-10-CM | POA: Diagnosis not present

## 2022-05-29 DIAGNOSIS — Z6836 Body mass index (BMI) 36.0-36.9, adult: Secondary | ICD-10-CM | POA: Diagnosis not present

## 2022-05-29 DIAGNOSIS — E785 Hyperlipidemia, unspecified: Secondary | ICD-10-CM | POA: Diagnosis not present

## 2022-05-29 DIAGNOSIS — E1159 Type 2 diabetes mellitus with other circulatory complications: Secondary | ICD-10-CM | POA: Diagnosis not present

## 2022-05-29 DIAGNOSIS — I152 Hypertension secondary to endocrine disorders: Secondary | ICD-10-CM | POA: Diagnosis not present

## 2022-05-29 DIAGNOSIS — E1169 Type 2 diabetes mellitus with other specified complication: Secondary | ICD-10-CM | POA: Diagnosis not present

## 2022-07-10 DIAGNOSIS — Z20822 Contact with and (suspected) exposure to covid-19: Secondary | ICD-10-CM | POA: Diagnosis not present

## 2022-07-10 DIAGNOSIS — J449 Chronic obstructive pulmonary disease, unspecified: Secondary | ICD-10-CM | POA: Diagnosis not present

## 2022-07-10 DIAGNOSIS — J111 Influenza due to unidentified influenza virus with other respiratory manifestations: Secondary | ICD-10-CM | POA: Diagnosis not present

## 2022-07-12 DIAGNOSIS — J441 Chronic obstructive pulmonary disease with (acute) exacerbation: Secondary | ICD-10-CM | POA: Diagnosis not present

## 2022-08-23 DIAGNOSIS — E1159 Type 2 diabetes mellitus with other circulatory complications: Secondary | ICD-10-CM | POA: Diagnosis not present

## 2022-08-23 DIAGNOSIS — E1140 Type 2 diabetes mellitus with diabetic neuropathy, unspecified: Secondary | ICD-10-CM | POA: Diagnosis not present

## 2022-08-23 DIAGNOSIS — E1169 Type 2 diabetes mellitus with other specified complication: Secondary | ICD-10-CM | POA: Diagnosis not present

## 2022-08-27 DIAGNOSIS — I152 Hypertension secondary to endocrine disorders: Secondary | ICD-10-CM | POA: Diagnosis not present

## 2022-08-27 DIAGNOSIS — E785 Hyperlipidemia, unspecified: Secondary | ICD-10-CM | POA: Diagnosis not present

## 2022-08-27 DIAGNOSIS — Z6836 Body mass index (BMI) 36.0-36.9, adult: Secondary | ICD-10-CM | POA: Diagnosis not present

## 2022-08-27 DIAGNOSIS — E1169 Type 2 diabetes mellitus with other specified complication: Secondary | ICD-10-CM | POA: Diagnosis not present

## 2022-08-27 DIAGNOSIS — E1159 Type 2 diabetes mellitus with other circulatory complications: Secondary | ICD-10-CM | POA: Diagnosis not present

## 2023-01-17 DIAGNOSIS — E1140 Type 2 diabetes mellitus with diabetic neuropathy, unspecified: Secondary | ICD-10-CM | POA: Diagnosis not present

## 2023-01-17 DIAGNOSIS — E1159 Type 2 diabetes mellitus with other circulatory complications: Secondary | ICD-10-CM | POA: Diagnosis not present

## 2023-01-17 DIAGNOSIS — E1169 Type 2 diabetes mellitus with other specified complication: Secondary | ICD-10-CM | POA: Diagnosis not present

## 2023-04-24 DIAGNOSIS — E1169 Type 2 diabetes mellitus with other specified complication: Secondary | ICD-10-CM | POA: Diagnosis not present

## 2023-04-24 DIAGNOSIS — E1140 Type 2 diabetes mellitus with diabetic neuropathy, unspecified: Secondary | ICD-10-CM | POA: Diagnosis not present

## 2023-06-24 DIAGNOSIS — R059 Cough, unspecified: Secondary | ICD-10-CM | POA: Diagnosis not present

## 2023-06-24 DIAGNOSIS — J441 Chronic obstructive pulmonary disease with (acute) exacerbation: Secondary | ICD-10-CM | POA: Diagnosis not present

## 2023-06-24 DIAGNOSIS — Z20822 Contact with and (suspected) exposure to covid-19: Secondary | ICD-10-CM | POA: Diagnosis not present

## 2023-07-23 DIAGNOSIS — E1140 Type 2 diabetes mellitus with diabetic neuropathy, unspecified: Secondary | ICD-10-CM | POA: Diagnosis not present

## 2023-07-23 DIAGNOSIS — E1169 Type 2 diabetes mellitus with other specified complication: Secondary | ICD-10-CM | POA: Diagnosis not present

## 2023-08-05 DIAGNOSIS — E1169 Type 2 diabetes mellitus with other specified complication: Secondary | ICD-10-CM | POA: Diagnosis not present

## 2023-08-05 DIAGNOSIS — E785 Hyperlipidemia, unspecified: Secondary | ICD-10-CM | POA: Diagnosis not present

## 2023-08-05 DIAGNOSIS — E1140 Type 2 diabetes mellitus with diabetic neuropathy, unspecified: Secondary | ICD-10-CM | POA: Diagnosis not present

## 2023-08-05 DIAGNOSIS — J449 Chronic obstructive pulmonary disease, unspecified: Secondary | ICD-10-CM | POA: Diagnosis not present

## 2023-10-24 DIAGNOSIS — E1140 Type 2 diabetes mellitus with diabetic neuropathy, unspecified: Secondary | ICD-10-CM | POA: Diagnosis not present

## 2023-10-24 DIAGNOSIS — E1169 Type 2 diabetes mellitus with other specified complication: Secondary | ICD-10-CM | POA: Diagnosis not present

## 2023-10-31 DIAGNOSIS — Z1331 Encounter for screening for depression: Secondary | ICD-10-CM | POA: Diagnosis not present

## 2023-10-31 DIAGNOSIS — E1169 Type 2 diabetes mellitus with other specified complication: Secondary | ICD-10-CM | POA: Diagnosis not present

## 2023-10-31 DIAGNOSIS — Z6834 Body mass index (BMI) 34.0-34.9, adult: Secondary | ICD-10-CM | POA: Diagnosis not present

## 2023-10-31 DIAGNOSIS — E785 Hyperlipidemia, unspecified: Secondary | ICD-10-CM | POA: Diagnosis not present

## 2023-10-31 DIAGNOSIS — E1159 Type 2 diabetes mellitus with other circulatory complications: Secondary | ICD-10-CM | POA: Diagnosis not present

## 2023-10-31 DIAGNOSIS — I152 Hypertension secondary to endocrine disorders: Secondary | ICD-10-CM | POA: Diagnosis not present

## 2023-11-25 ENCOUNTER — Other Ambulatory Visit: Payer: Self-pay | Admitting: Family Medicine

## 2023-11-25 DIAGNOSIS — Z1231 Encounter for screening mammogram for malignant neoplasm of breast: Secondary | ICD-10-CM

## 2023-11-27 ENCOUNTER — Ambulatory Visit
Admission: RE | Admit: 2023-11-27 | Discharge: 2023-11-27 | Disposition: A | Discharge status: Home / Self Care | Source: Ambulatory Visit | Attending: Family Medicine | Admitting: Family Medicine

## 2023-11-27 DIAGNOSIS — Z1231 Encounter for screening mammogram for malignant neoplasm of breast: Secondary | ICD-10-CM | POA: Diagnosis not present

## 2023-12-02 ENCOUNTER — Other Ambulatory Visit: Payer: Self-pay | Admitting: Family Medicine

## 2023-12-02 DIAGNOSIS — R928 Other abnormal and inconclusive findings on diagnostic imaging of breast: Secondary | ICD-10-CM

## 2023-12-09 ENCOUNTER — Ambulatory Visit
Admission: RE | Admit: 2023-12-09 | Discharge: 2023-12-09 | Disposition: A | Discharge status: Home / Self Care | Source: Ambulatory Visit | Attending: Family Medicine | Admitting: Family Medicine

## 2023-12-09 DIAGNOSIS — R920 Mammographic microcalcification found on diagnostic imaging of breast: Secondary | ICD-10-CM | POA: Diagnosis not present

## 2023-12-09 DIAGNOSIS — R928 Other abnormal and inconclusive findings on diagnostic imaging of breast: Secondary | ICD-10-CM

## 2023-12-10 ENCOUNTER — Other Ambulatory Visit: Payer: Self-pay | Admitting: Family Medicine

## 2023-12-10 DIAGNOSIS — R921 Mammographic calcification found on diagnostic imaging of breast: Secondary | ICD-10-CM

## 2024-01-28 DIAGNOSIS — E1169 Type 2 diabetes mellitus with other specified complication: Secondary | ICD-10-CM | POA: Diagnosis not present

## 2024-01-28 DIAGNOSIS — E1140 Type 2 diabetes mellitus with diabetic neuropathy, unspecified: Secondary | ICD-10-CM | POA: Diagnosis not present

## 2024-02-04 DIAGNOSIS — F330 Major depressive disorder, recurrent, mild: Secondary | ICD-10-CM | POA: Diagnosis not present

## 2024-02-04 DIAGNOSIS — E785 Hyperlipidemia, unspecified: Secondary | ICD-10-CM | POA: Diagnosis not present

## 2024-02-04 DIAGNOSIS — I10 Essential (primary) hypertension: Secondary | ICD-10-CM | POA: Diagnosis not present

## 2024-02-04 DIAGNOSIS — Z6833 Body mass index (BMI) 33.0-33.9, adult: Secondary | ICD-10-CM | POA: Diagnosis not present

## 2024-02-04 DIAGNOSIS — E1169 Type 2 diabetes mellitus with other specified complication: Secondary | ICD-10-CM | POA: Diagnosis not present

## 2024-03-10 DIAGNOSIS — Z6833 Body mass index (BMI) 33.0-33.9, adult: Secondary | ICD-10-CM | POA: Diagnosis not present

## 2024-03-10 DIAGNOSIS — Z20822 Contact with and (suspected) exposure to covid-19: Secondary | ICD-10-CM | POA: Diagnosis not present

## 2024-03-10 DIAGNOSIS — R059 Cough, unspecified: Secondary | ICD-10-CM | POA: Diagnosis not present

## 2024-03-10 DIAGNOSIS — J441 Chronic obstructive pulmonary disease with (acute) exacerbation: Secondary | ICD-10-CM | POA: Diagnosis not present

## 2024-04-11 DIAGNOSIS — E119 Type 2 diabetes mellitus without complications: Secondary | ICD-10-CM | POA: Diagnosis not present

## 2024-04-11 DIAGNOSIS — H40003 Preglaucoma, unspecified, bilateral: Secondary | ICD-10-CM | POA: Diagnosis not present

## 2024-05-11 DIAGNOSIS — E1169 Type 2 diabetes mellitus with other specified complication: Secondary | ICD-10-CM | POA: Diagnosis not present

## 2024-05-11 DIAGNOSIS — E1159 Type 2 diabetes mellitus with other circulatory complications: Secondary | ICD-10-CM | POA: Diagnosis not present

## 2024-05-11 DIAGNOSIS — F330 Major depressive disorder, recurrent, mild: Secondary | ICD-10-CM | POA: Diagnosis not present

## 2024-05-11 DIAGNOSIS — E785 Hyperlipidemia, unspecified: Secondary | ICD-10-CM | POA: Diagnosis not present

## 2024-05-11 DIAGNOSIS — Z6833 Body mass index (BMI) 33.0-33.9, adult: Secondary | ICD-10-CM | POA: Diagnosis not present

## 2024-05-18 DIAGNOSIS — F17210 Nicotine dependence, cigarettes, uncomplicated: Secondary | ICD-10-CM | POA: Diagnosis not present

## 2024-05-18 DIAGNOSIS — Z122 Encounter for screening for malignant neoplasm of respiratory organs: Secondary | ICD-10-CM | POA: Diagnosis not present
